# Patient Record
Sex: Female | Born: 1955 | Race: White | Hispanic: No | Marital: Married
Health system: Southern US, Community
[De-identification: ages and names within clinical notes are randomized; demographics above are authoritative.]

---

## 2012-10-20 ENCOUNTER — Other Ambulatory Visit: Payer: Self-pay | Admitting: Family Medicine

## 2012-10-20 DIAGNOSIS — M8580 Other specified disorders of bone density and structure, unspecified site: Secondary | ICD-10-CM

## 2012-10-20 DIAGNOSIS — Z1231 Encounter for screening mammogram for malignant neoplasm of breast: Secondary | ICD-10-CM

## 2012-11-03 ENCOUNTER — Ambulatory Visit
Admission: RE | Admit: 2012-11-03 | Discharge: 2012-11-03 | Disposition: A | Discharge status: Home / Self Care | Payer: Managed Care, Other (non HMO) | Source: Ambulatory Visit | Attending: Family Medicine | Admitting: Family Medicine

## 2012-11-03 DIAGNOSIS — Z1231 Encounter for screening mammogram for malignant neoplasm of breast: Secondary | ICD-10-CM

## 2012-11-03 DIAGNOSIS — M8580 Other specified disorders of bone density and structure, unspecified site: Secondary | ICD-10-CM

## 2013-05-24 ENCOUNTER — Other Ambulatory Visit: Payer: Self-pay | Admitting: Gastroenterology

## 2013-05-24 DIAGNOSIS — K219 Gastro-esophageal reflux disease without esophagitis: Secondary | ICD-10-CM

## 2013-06-29 ENCOUNTER — Ambulatory Visit
Admission: RE | Admit: 2013-06-29 | Discharge: 2013-06-29 | Disposition: A | Discharge status: Home / Self Care | Payer: Managed Care, Other (non HMO) | Source: Ambulatory Visit | Attending: Gastroenterology | Admitting: Gastroenterology

## 2013-06-29 DIAGNOSIS — K219 Gastro-esophageal reflux disease without esophagitis: Secondary | ICD-10-CM

## 2013-09-06 ENCOUNTER — Other Ambulatory Visit: Payer: Self-pay | Admitting: Family Medicine

## 2013-09-06 DIAGNOSIS — R1031 Right lower quadrant pain: Secondary | ICD-10-CM

## 2013-09-09 ENCOUNTER — Other Ambulatory Visit: Payer: Managed Care, Other (non HMO)

## 2013-09-15 ENCOUNTER — Ambulatory Visit
Admission: RE | Admit: 2013-09-15 | Discharge: 2013-09-15 | Disposition: A | Discharge status: Home / Self Care | Payer: Managed Care, Other (non HMO) | Source: Ambulatory Visit | Attending: Family Medicine | Admitting: Family Medicine

## 2013-09-15 DIAGNOSIS — R1031 Right lower quadrant pain: Secondary | ICD-10-CM

## 2013-11-18 ENCOUNTER — Other Ambulatory Visit (HOSPITAL_COMMUNITY)
Admission: RE | Admit: 2013-11-18 | Discharge: 2013-11-18 | Disposition: A | Discharge status: Home / Self Care | Payer: Managed Care, Other (non HMO) | Source: Ambulatory Visit | Attending: Family Medicine | Admitting: Family Medicine

## 2013-11-18 DIAGNOSIS — Z124 Encounter for screening for malignant neoplasm of cervix: Secondary | ICD-10-CM | POA: Insufficient documentation

## 2013-11-18 DIAGNOSIS — Z1151 Encounter for screening for human papillomavirus (HPV): Secondary | ICD-10-CM | POA: Insufficient documentation

## 2014-08-23 IMAGING — RF DG UGI W/ HIGH DENSITY W/KUB
15 of 20 series · 18 of 24 positions shown · non-contrast
Comparison: None.

FLUOROSCOPY TIME:  2 min 42 seconds

CLINICAL DATA: Esophageal reflux

EXAM:
UPPER GI SERIES W/HIGH DENSITY W/KUB
TECHNIQUE: After obtaining a scout radiograph a routine upper GI series was
performed using thin and high density barium.

[Series 1: run · 4 of 23 slices shown (1 of 14)]
[im 1/23]
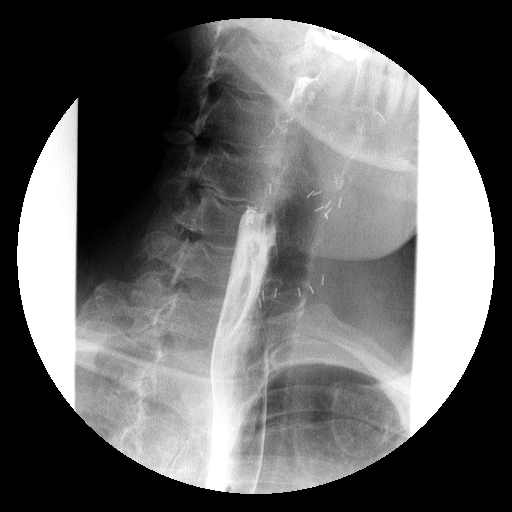
[im 12/23]
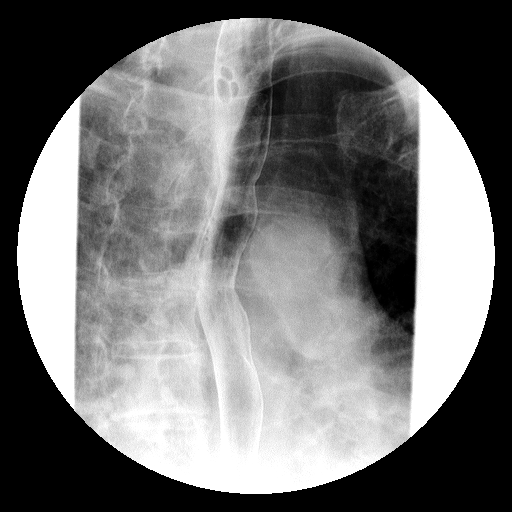
[im 17/23]
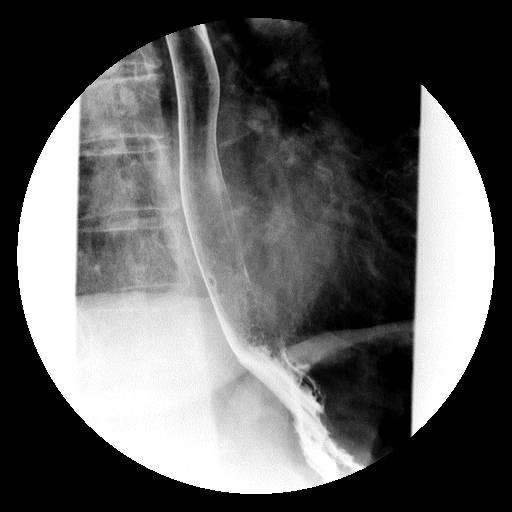
[im 23/23]
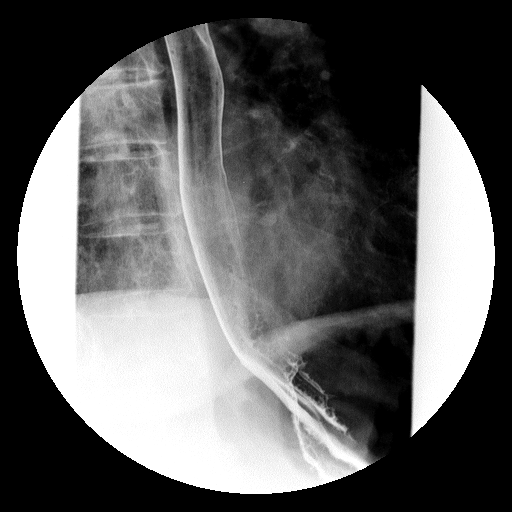

[Series 3: run · 1 of 2 slices shown (2 of 14)]
[im 1/2]
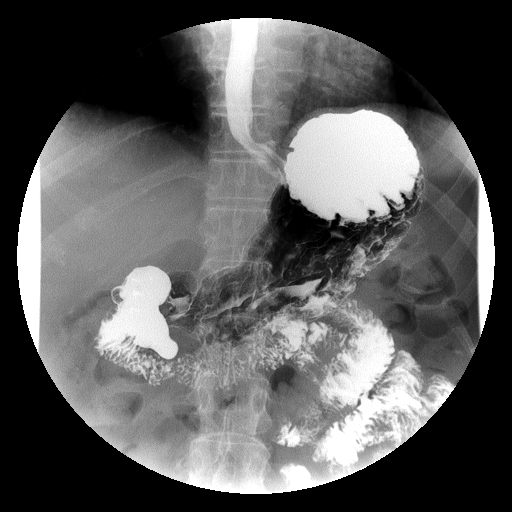

[Series 4: run · 1 of 1 slices shown (3 of 14)]
[im 1/1]
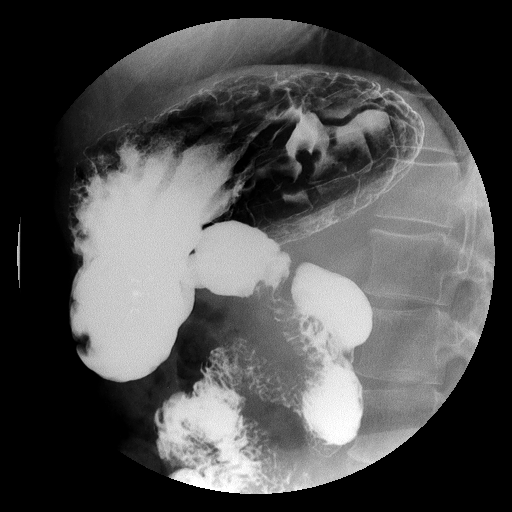

[Series 5: run · 1 of 1 slices shown (4 of 14)]
[im 1/1]
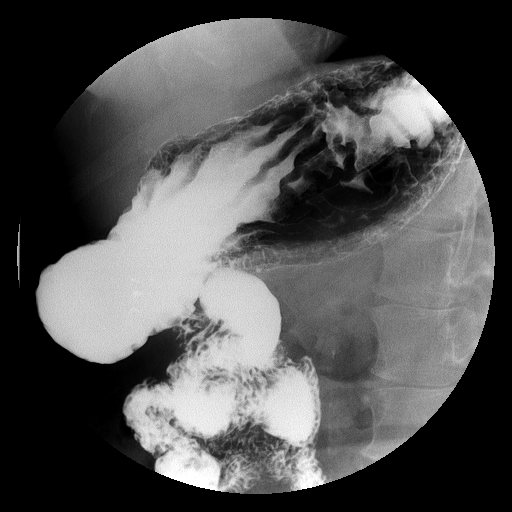

[Series 7: run · 1 of 1 slices shown (5 of 14)]
[im 1/1]
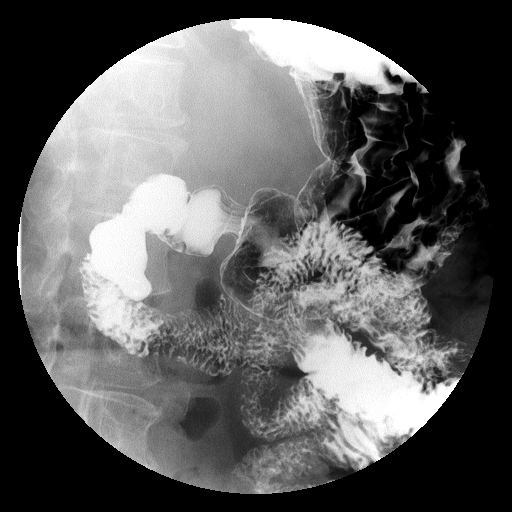

[Series 8: run · 1 of 1 slices shown (6 of 14)]
[im 1/1]
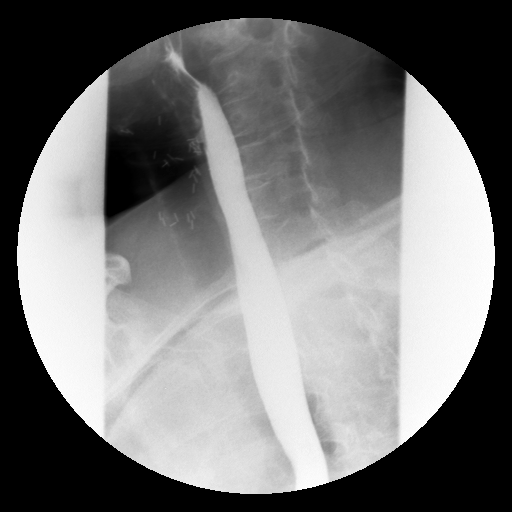

[Series 9: run · 1 of 1 slices shown (7 of 14)]
[im 1/1]
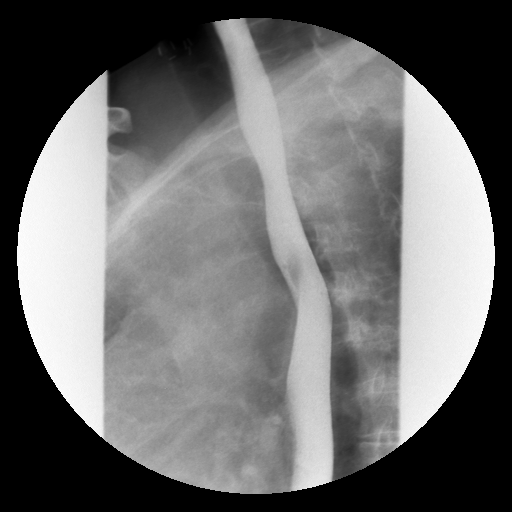

[Series 11: run · 1 of 1 slices shown (8 of 14)]
[im 1/1]
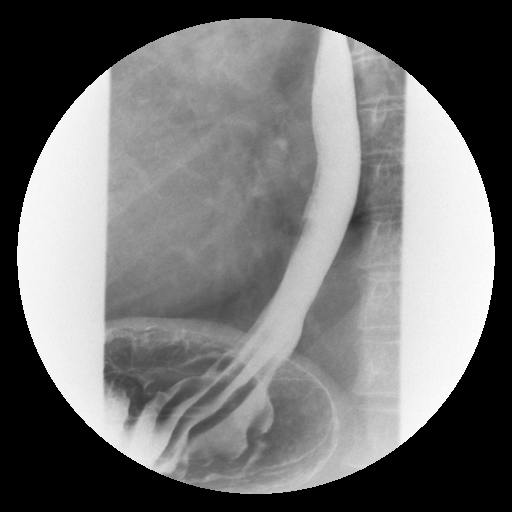

[Series 12: run · 1 of 1 slices shown (9 of 14)]
[im 1/1]
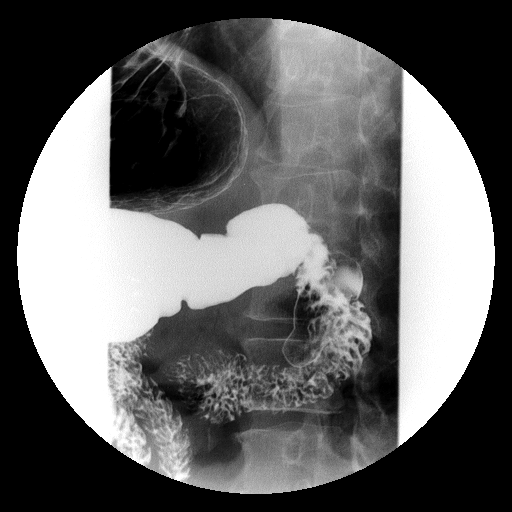

[Series 13: run · 1 of 1 slices shown (10 of 14)]
[im 1/1]
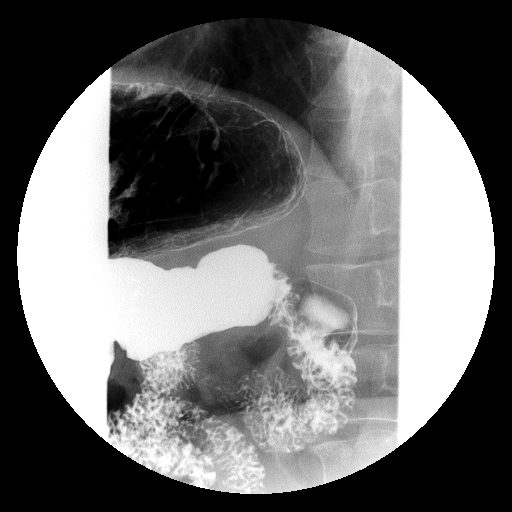

[Series 15: run · 1 of 1 slices shown (11 of 14)]
[im 1/1]
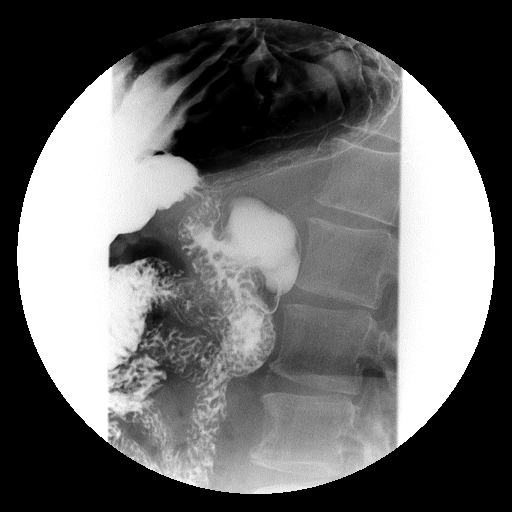

[Series 16: run · 1 of 1 slices shown (12 of 14)]
[im 1/1]
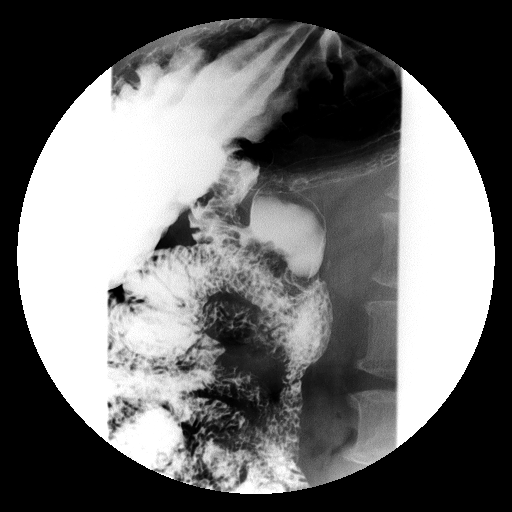

[Series 17: run · 1 of 1 slices shown (13 of 14)]
[im 1/1]
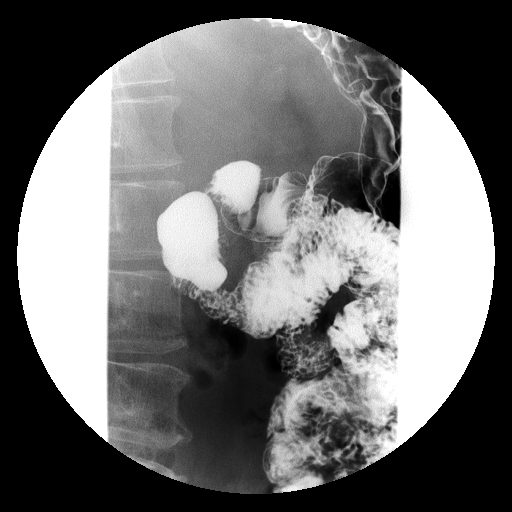

[Series 19: run · 1 of 1 slices shown (14 of 14)]
[im 1/1]
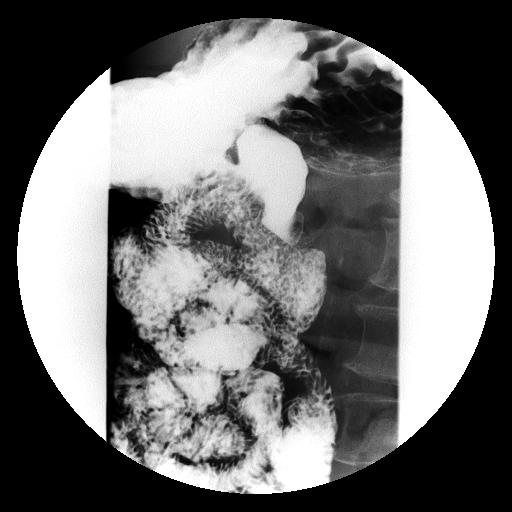

[Series 1001: view not recorded · 0.20mm/px · 1 of 1 slices shown]
[im 1/1]
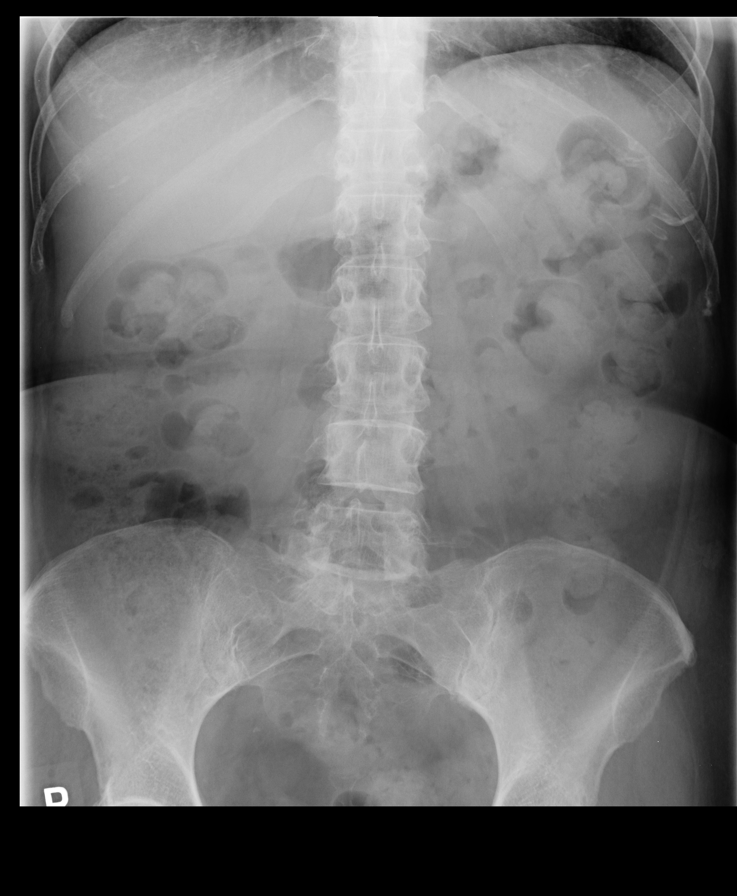

[18 of 24 positions shown; findings below may reference images not displayed]

FINDINGS: Preliminary radiograph is negative

Esophageal mucosa and motility are normal. No hiatal hernia was
seen. Mild gastroesophageal reflux. Negative for stricture or mass.

Stomach is incompletely distended but shows no significant mucosal
edema or mass.

There is a large diverticulum of the second portion of the duodenum.
This fills with gas as well as contrast and freely communicates with
the duodenal lumen. The diverticulum projects posterior and medial
to the duodenum. No obstruction of the duodenum is identified.
IMPRESSION: Mild gastroesophageal reflux without hiatal hernia

Large duodenum diverticulum of the second portion.
